# Patient Record
Sex: Male | Born: 1960 | Race: White | Hispanic: No | Marital: Married | Smoking: Never smoker
Health system: Southern US, Community
[De-identification: ages and names within clinical notes are randomized; demographics above are authoritative.]

## PROBLEM LIST (undated history)

## (undated) DIAGNOSIS — I10 Essential (primary) hypertension: Secondary | ICD-10-CM

## (undated) DIAGNOSIS — K648 Other hemorrhoids: Secondary | ICD-10-CM

## (undated) DIAGNOSIS — N189 Chronic kidney disease, unspecified: Secondary | ICD-10-CM

## (undated) DIAGNOSIS — E119 Type 2 diabetes mellitus without complications: Secondary | ICD-10-CM

## (undated) HISTORY — PX: COLONOSCOPY: SHX174

---

## 2004-04-14 ENCOUNTER — Ambulatory Visit: Payer: Self-pay

## 2006-03-25 ENCOUNTER — Ambulatory Visit: Payer: Self-pay

## 2007-07-05 ENCOUNTER — Ambulatory Visit: Payer: Self-pay | Admitting: Family Medicine

## 2007-11-18 IMAGING — CT CT ABD-PELV W/O CM
1 of 2 series · 15 of 32 positions shown, 19 images · non-contrast
Comparison: none

REASON FOR EXAM: Right flank pain, history of stones
COMMENTS:

[Series 2: stone · axial · 0.91mm/px · z∈[+337,+814]mm · 15 of 173 slices shown, 19 images]
[im 7/173  soft-tissue]
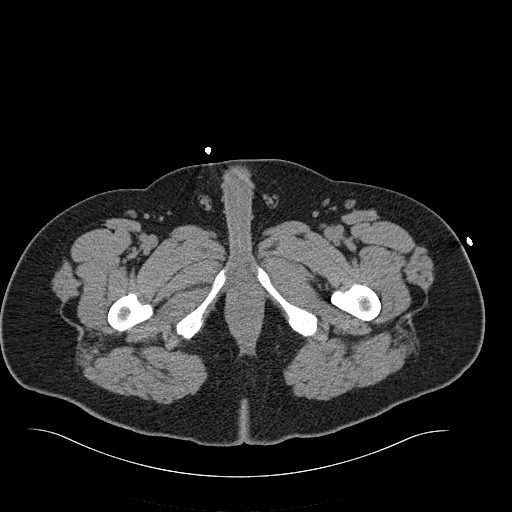
[im 7/173  bone]
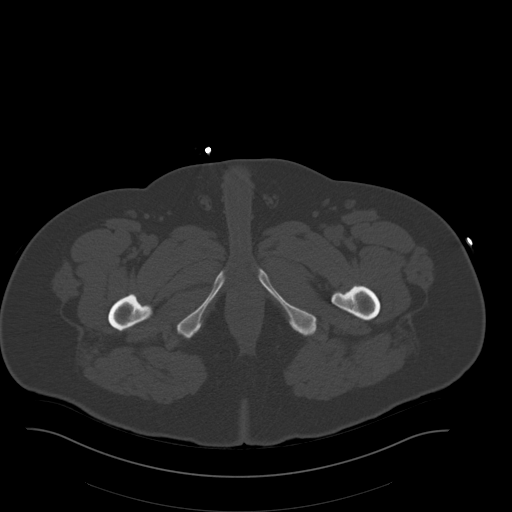
[im 21/173  soft-tissue]
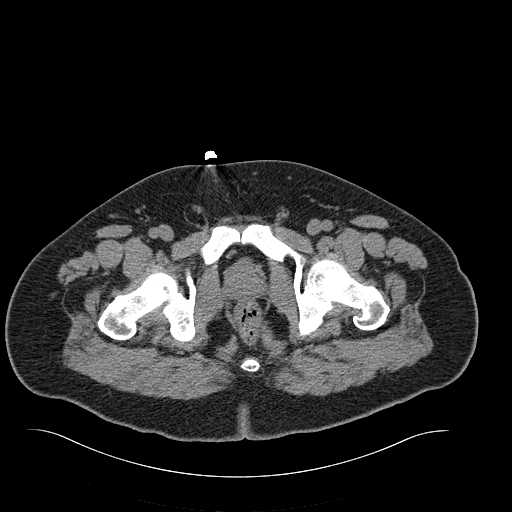
[im 35/173  soft-tissue]
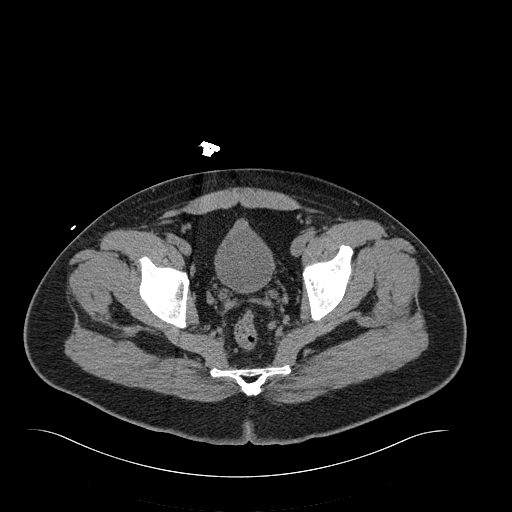
[im 49/173  soft-tissue]
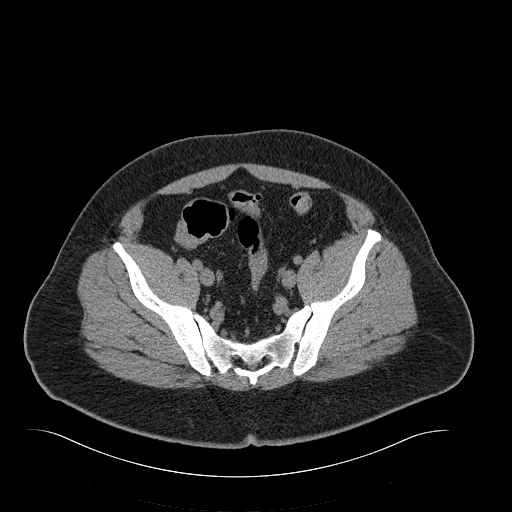
[im 62/173  soft-tissue]
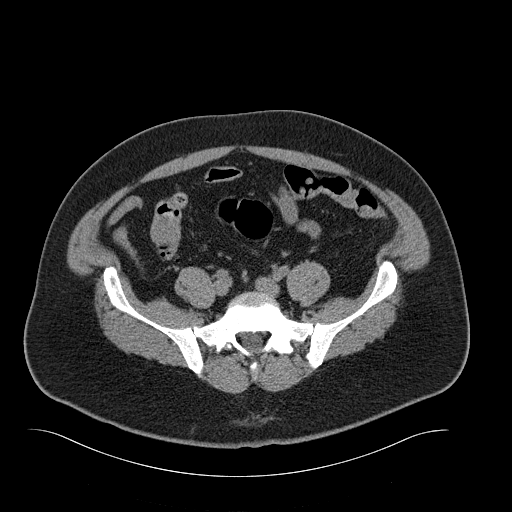
[im 76/173  soft-tissue]
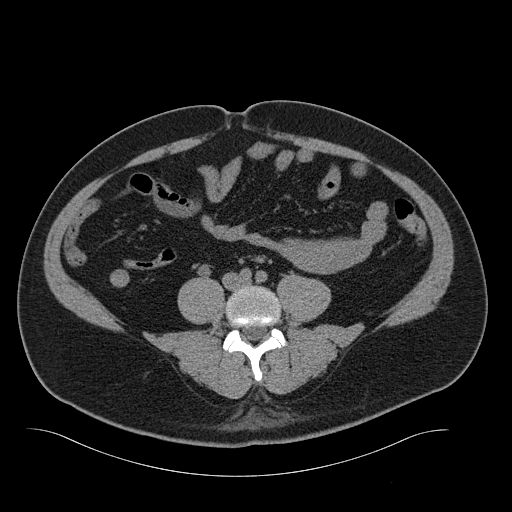
[im 90/173  soft-tissue]
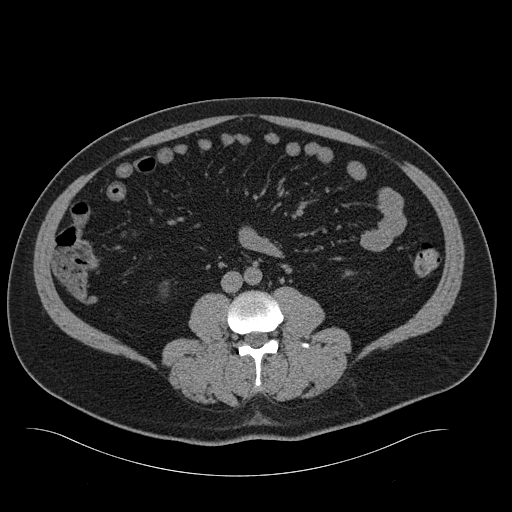
[im 97/173  soft-tissue]
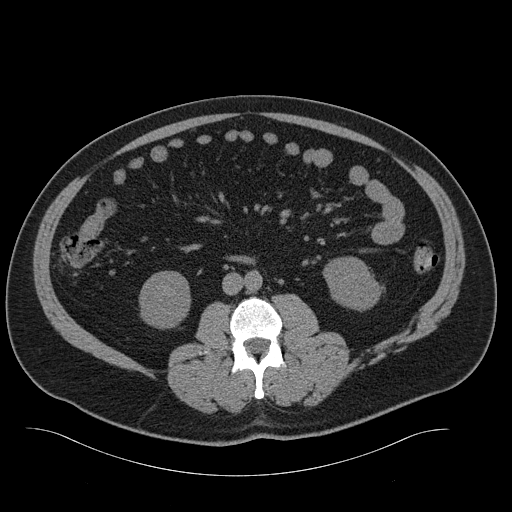
[im 111/173  soft-tissue]
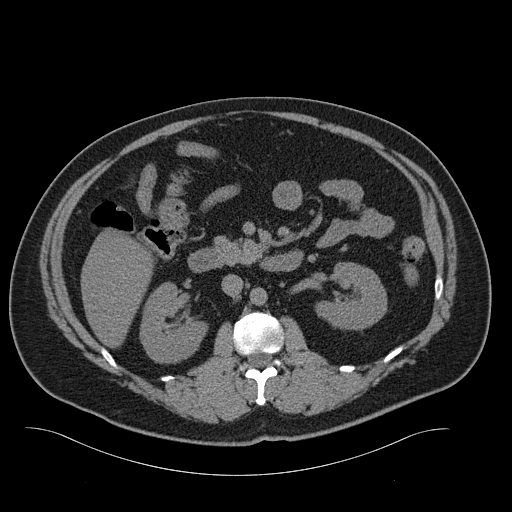
[im 111/173  bone]
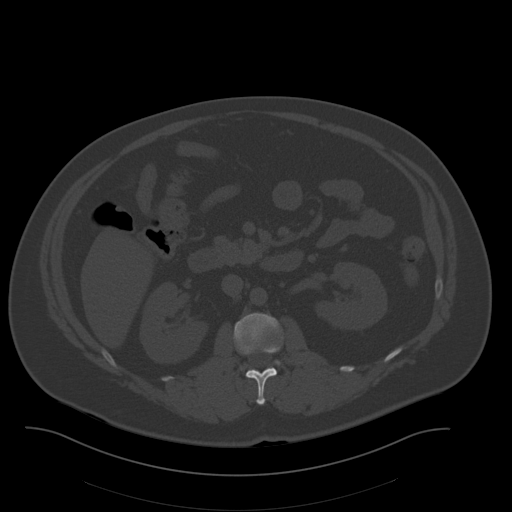
[im 124/173  soft-tissue]
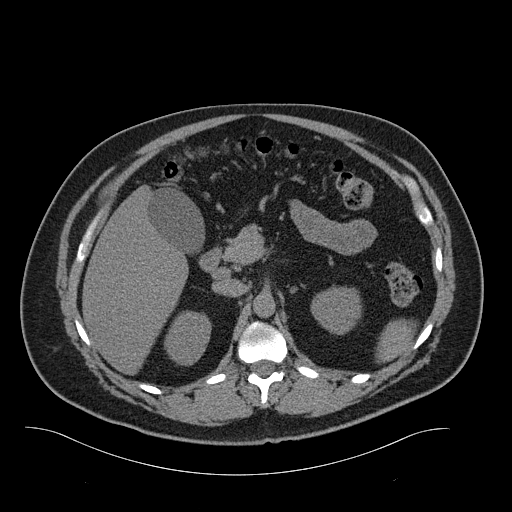
[im 138/173  soft-tissue]
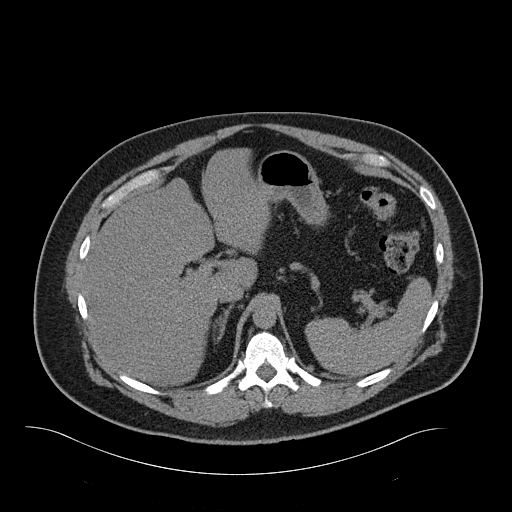
[im 145/173  lung]
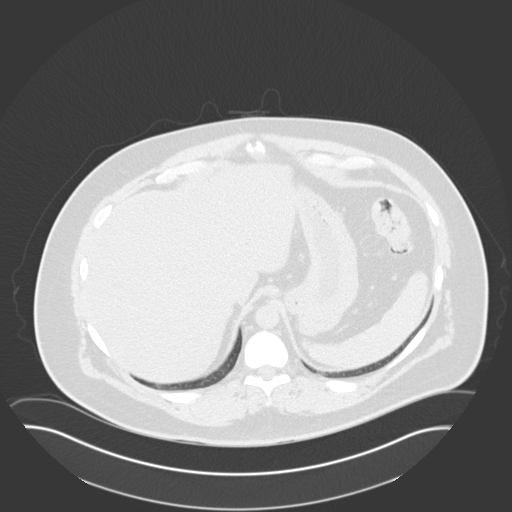
[im 152/173  soft-tissue]
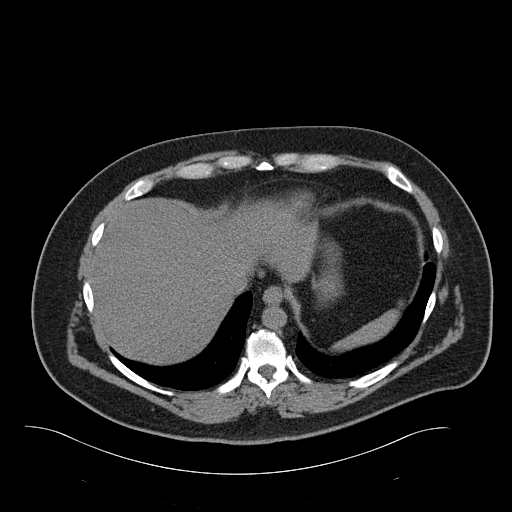
[im 152/173  lung]
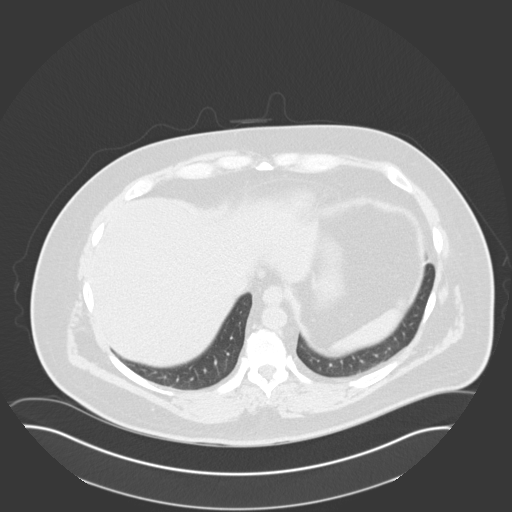
[im 159/173  lung]
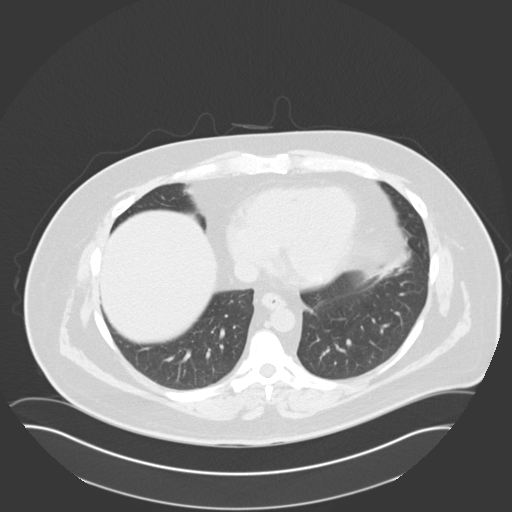
[im 166/173  soft-tissue]
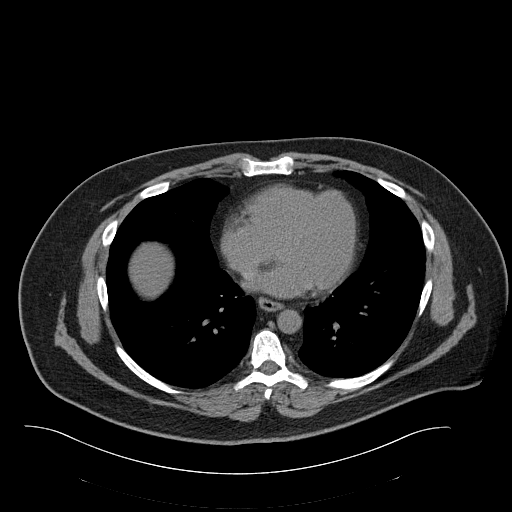
[im 166/173  lung]
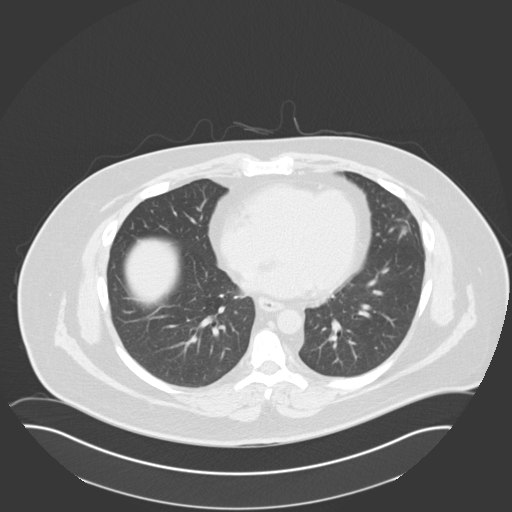

[15 of 32 positions shown; findings below may reference images not displayed]

PROCEDURE:     CT  - CT ABDOMEN AND PELVIS W[DATE] [DATE]

RESULT:  Helical noncontrast 3.0 mm sections were obtained from the lung
bases through the pubic symphysis.

Evaluation of the lung bases demonstrates no gross abnormalities. Evaluation
of the LEFT kidney demonstrates a small, 1.0 to 2.0 mm calcified density
within the medullary portion of the kidney. This is in the posterior lateral
lower pole region. There is no evidence of hydronephrosis or gross evidence
of masses. Evaluation of the RIGHT kidney demonstrates no evidence of
hydronephrosis, masses or calculi. Noncontrast evaluation of the liver,
spleen, adrenals and pancreas is unremarkable. There is no evidence of upper
abdominal free fluid, drainable loculated fluid collections, masses or
adenopathy. The appendix is identified within the RIGHT lower quadrant and
is unremarkable. There is no evidence of pelvic free fluid, drainable
loculated fluid collections, masses or adenopathy. No evidence of abdominal
aortic aneurysm is appreciated. There is no evidence of calcified gallstones.
IMPRESSION: Small, 2.0 mm, non-obstructing, medullary calculus within
the LEFT kidney. Otherwise, no further abnormality is appreciated.

## 2015-05-26 ENCOUNTER — Other Ambulatory Visit: Payer: Self-pay | Admitting: Gastroenterology

## 2015-05-26 DIAGNOSIS — R7989 Other specified abnormal findings of blood chemistry: Secondary | ICD-10-CM

## 2015-05-26 DIAGNOSIS — R945 Abnormal results of liver function studies: Secondary | ICD-10-CM

## 2015-06-02 ENCOUNTER — Ambulatory Visit
Admission: RE | Admit: 2015-06-02 | Discharge: 2015-06-02 | Disposition: A | Discharge status: Home / Self Care | Payer: 59 | Source: Ambulatory Visit | Attending: Gastroenterology | Admitting: Gastroenterology

## 2015-06-02 DIAGNOSIS — R945 Abnormal results of liver function studies: Secondary | ICD-10-CM

## 2015-06-02 DIAGNOSIS — R7989 Other specified abnormal findings of blood chemistry: Secondary | ICD-10-CM | POA: Insufficient documentation

## 2015-06-02 DIAGNOSIS — K760 Fatty (change of) liver, not elsewhere classified: Secondary | ICD-10-CM | POA: Insufficient documentation

## 2015-06-25 ENCOUNTER — Encounter: Payer: Self-pay | Admitting: *Deleted

## 2015-06-26 ENCOUNTER — Encounter
Admission: RE | Disposition: A | Discharge status: Home / Self Care | Payer: Self-pay | Source: Ambulatory Visit | Attending: Gastroenterology

## 2015-06-26 ENCOUNTER — Encounter: Payer: Self-pay | Admitting: *Deleted

## 2015-06-26 ENCOUNTER — Ambulatory Visit: Payer: 59 | Admitting: Anesthesiology

## 2015-06-26 ENCOUNTER — Ambulatory Visit
Admission: RE | Admit: 2015-06-26 | Discharge: 2015-06-26 | Disposition: A | Discharge status: Home / Self Care | Payer: 59 | Source: Ambulatory Visit | Attending: Gastroenterology | Admitting: Gastroenterology

## 2015-06-26 DIAGNOSIS — Z87442 Personal history of urinary calculi: Secondary | ICD-10-CM | POA: Insufficient documentation

## 2015-06-26 DIAGNOSIS — E1122 Type 2 diabetes mellitus with diabetic chronic kidney disease: Secondary | ICD-10-CM | POA: Diagnosis not present

## 2015-06-26 DIAGNOSIS — Z79899 Other long term (current) drug therapy: Secondary | ICD-10-CM | POA: Insufficient documentation

## 2015-06-26 DIAGNOSIS — I129 Hypertensive chronic kidney disease with stage 1 through stage 4 chronic kidney disease, or unspecified chronic kidney disease: Secondary | ICD-10-CM | POA: Insufficient documentation

## 2015-06-26 DIAGNOSIS — K5730 Diverticulosis of large intestine without perforation or abscess without bleeding: Secondary | ICD-10-CM | POA: Insufficient documentation

## 2015-06-26 DIAGNOSIS — N189 Chronic kidney disease, unspecified: Secondary | ICD-10-CM | POA: Diagnosis not present

## 2015-06-26 DIAGNOSIS — Z7984 Long term (current) use of oral hypoglycemic drugs: Secondary | ICD-10-CM | POA: Insufficient documentation

## 2015-06-26 DIAGNOSIS — Z1211 Encounter for screening for malignant neoplasm of colon: Secondary | ICD-10-CM | POA: Insufficient documentation

## 2015-06-26 HISTORY — DX: Other hemorrhoids: K64.8

## 2015-06-26 HISTORY — DX: Chronic kidney disease, unspecified: N18.9

## 2015-06-26 HISTORY — DX: Type 2 diabetes mellitus without complications: E11.9

## 2015-06-26 HISTORY — DX: Essential (primary) hypertension: I10

## 2015-06-26 HISTORY — PX: COLONOSCOPY WITH PROPOFOL: SHX5780

## 2015-06-26 LAB — GLUCOSE, CAPILLARY: GLUCOSE-CAPILLARY: 279 mg/dL — AB (ref 65–99)

## 2015-06-26 SURGERY — COLONOSCOPY WITH PROPOFOL
Anesthesia: General

## 2015-06-26 MED ORDER — LIDOCAINE HCL (PF) 1 % IJ SOLN
2.0000 mL | Freq: Once | INTRAMUSCULAR | Status: AC
  Administered 2015-06-26: 0.03 mL via INTRADERMAL

## 2015-06-26 MED ORDER — LIDOCAINE HCL (PF) 1 % IJ SOLN
INTRAMUSCULAR | Status: AC
Start: 2015-06-26 — End: 2015-06-26
  Administered 2015-06-26: 0.03 mL via INTRADERMAL
  Filled 2015-06-26: qty 2

## 2015-06-26 MED ORDER — PROPOFOL 10 MG/ML IV BOLUS
INTRAVENOUS | Status: DC | PRN
  Administered 2015-06-26: 100 mg via INTRAVENOUS

## 2015-06-26 MED ORDER — PHENYLEPHRINE HCL 10 MG/ML IJ SOLN
INTRAMUSCULAR | Status: DC | PRN
  Administered 2015-06-26: 100 ug via INTRAVENOUS

## 2015-06-26 MED ORDER — PROPOFOL 500 MG/50ML IV EMUL
INTRAVENOUS | Status: DC | PRN
  Administered 2015-06-26: 200 ug/kg/min via INTRAVENOUS

## 2015-06-26 MED ORDER — SODIUM CHLORIDE 0.9 % IV SOLN
INTRAVENOUS | Status: DC
  Administered 2015-06-26: 1000 mL via INTRAVENOUS

## 2015-06-26 MED ORDER — MIDAZOLAM HCL 5 MG/5ML IJ SOLN
INTRAMUSCULAR | Status: DC | PRN
  Administered 2015-06-26: 1 mg via INTRAVENOUS

## 2015-06-26 MED ORDER — SODIUM CHLORIDE 0.9 % IV SOLN
INTRAVENOUS | Status: DC
  Administered 2015-06-26: 13:00:00 via INTRAVENOUS

## 2015-06-26 MED ORDER — FENTANYL CITRATE (PF) 100 MCG/2ML IJ SOLN
INTRAMUSCULAR | Status: DC | PRN
  Administered 2015-06-26: 50 ug via INTRAVENOUS

## 2015-06-26 MED ORDER — LIDOCAINE 2% (20 MG/ML) 5 ML SYRINGE
INTRAMUSCULAR | Status: DC | PRN
  Administered 2015-06-26: 40 mg via INTRAVENOUS

## 2015-06-26 NOTE — H&P (Signed)
    Primary Care Physician:  Jerl MinaJames Hedrick, MD Primary Gastroenterologist:  Dr. Bluford Kaufmannh  Pre-Procedure History & Physical: HPI:  James Poole is a 55 y.o. male is here for an colonoscopy   Past Medical History  Diagnosis Date  . Hypertension   . Internal hemorrhoids   . Chronic kidney disease     Kidney Stones    Past Surgical History  Procedure Laterality Date  . Colonoscopy      Prior to Admission medications   Medication Sig Start Date End Date Taking? Authorizing Provider  fluticasone (FLONASE) 50 MCG/ACT nasal spray Place 2 sprays into both nostrils daily.   Yes Historical Provider, MD  loratadine (CLARITIN) 10 MG tablet Take 10 mg by mouth daily.   Yes Historical Provider, MD  metFORMIN (GLUCOPHAGE) 500 MG tablet Take by mouth 2 (two) times daily with a meal.   Yes Historical Provider, MD  olmesartan-hydrochlorothiazide (BENICAR HCT) 20-12.5 MG tablet Take 1 tablet by mouth daily.   Yes Historical Provider, MD    Allergies as of 06/17/2015  . (Not on File)    History reviewed. No pertinent family history.  Social History   Social History  . Marital Status: Married    Spouse Name: N/A  . Number of Children: N/A  . Years of Education: N/A   Occupational History  . Not on file.   Social History Main Topics  . Smoking status: Never Smoker   . Smokeless tobacco: Never Used  . Alcohol Use: No  . Drug Use: No  . Sexual Activity: Not on file   Other Topics Concern  . Not on file   Social History Narrative  . No narrative on file    Review of Systems: See HPI, otherwise negative ROS  Physical Exam: There were no vitals taken for this visit. General:   Alert,  pleasant and cooperative in NAD Head:  Normocephalic and atraumatic. Neck:  Supple; no masses or thyromegaly. Lungs:  Clear throughout to auscultation.    Heart:  Regular rate and rhythm. Abdomen:  Soft, nontender and nondistended. Normal bowel sounds, without guarding, and without rebound.    Neurologic:  Alert and  oriented x4;  grossly normal neurologically.  Impression/Plan: James Poole is here for an colonoscopy to be performed for screening  Risks, benefits, limitations, and alternatives regarding colonoscopy have been reviewed with the patient.  Questions have been answered.  All parties agreeable.   Patrcia Schnepp, Ezzard StandingPAUL Y, MD  06/26/2015, 12:28 PM

## 2015-06-26 NOTE — Anesthesia Preprocedure Evaluation (Signed)
Anesthesia Evaluation  Patient identified by MRN, date of birth, ID band Patient awake    Reviewed: Allergy & Precautions, H&P , NPO status , Patient's Chart, lab work & pertinent test results  History of Anesthesia Complications Negative for: history of anesthetic complications  Airway Mallampati: III  TM Distance: >3 FB Neck ROM: full    Dental  (+) Poor Dentition   Pulmonary neg pulmonary ROS, neg shortness of breath,    Pulmonary exam normal breath sounds clear to auscultation       Cardiovascular Exercise Tolerance: Good hypertension, (-) angina(-) Past MI and (-) DOE Normal cardiovascular exam Rhythm:regular Rate:Normal     Neuro/Psych negative neurological ROS  negative psych ROS   GI/Hepatic Neg liver ROS, GERD  Controlled and Medicated,  Endo/Other  diabetes, Type 2  Renal/GU Renal disease  negative genitourinary   Musculoskeletal   Abdominal   Peds  Hematology negative hematology ROS (+)   Anesthesia Other Findings Past Medical History:   Hypertension                                                 Internal hemorrhoids                                         Chronic kidney disease                                         Comment:Kidney Stones   Diabetes mellitus without complication (HCC)                Past Surgical History:   COLONOSCOPY                                                  BMI    Body Mass Index   33.48 kg/m 2      Reproductive/Obstetrics negative OB ROS                             Anesthesia Physical Anesthesia Plan  ASA: III  Anesthesia Plan: General   Post-op Pain Management:    Induction:   Airway Management Planned:   Additional Equipment:   Intra-op Plan:   Post-operative Plan:   Informed Consent: I have reviewed the patients History and Physical, chart, labs and discussed the procedure including the risks, benefits and alternatives  for the proposed anesthesia with the patient or authorized representative who has indicated his/her understanding and acceptance.   Dental Advisory Given  Plan Discussed with: Anesthesiologist, CRNA and Surgeon  Anesthesia Plan Comments:         Anesthesia Quick Evaluation

## 2015-06-26 NOTE — Anesthesia Postprocedure Evaluation (Signed)
Anesthesia Post Note  Patient: James Poole  Procedure(s) Performed: Procedure(s) (LRB): COLONOSCOPY WITH PROPOFOL (N/A)  Patient location during evaluation: PACU Anesthesia Type: General Level of consciousness: awake and alert Pain management: pain level controlled Vital Signs Assessment: post-procedure vital signs reviewed and stable Respiratory status: spontaneous breathing, nonlabored ventilation, respiratory function stable and patient connected to nasal cannula oxygen Cardiovascular status: blood pressure returned to baseline and stable Postop Assessment: no signs of nausea or vomiting Anesthetic complications: no    Last Vitals:  Filed Vitals:   06/26/15 1233 06/26/15 1331  BP: 145/87 121/76  Pulse: 112 107  Temp: 36.3 C 36.3 C  Resp: 17 25    Last Pain: There were no vitals filed for this visit.               [name]

## 2015-06-26 NOTE — Transfer of Care (Signed)
Immediate Anesthesia Transfer of Care Note  Patient: James Poole  Procedure(s) Performed: Procedure(s): COLONOSCOPY WITH PROPOFOL (N/A)  Patient Location: PACU and Endoscopy Unit  Anesthesia Type:General  Level of Consciousness: awake, oriented and patient cooperative  Airway & Oxygen Therapy: Patient Spontanous Breathing and Patient connected to nasal cannula oxygen  Post-op Assessment: Report given to RN and Post -op Vital signs reviewed and stable  Post vital signs: Reviewed and stable  Last Vitals:  Filed Vitals:   06/26/15 1233  BP: 145/87  Pulse: 112  Temp: 36.3 C  Resp: 17    Last Pain: There were no vitals filed for this visit.       Complications: No apparent anesthesia complications

## 2015-06-26 NOTE — Op Note (Signed)
[facility] Gastroenterology Patient Name: James Poole Procedure Date: 06/26/2015 1:02 PM MRN: [id number] Account #: [id number] Date of Birth: 1960/12/02 Admit Type: Outpatient Age: 55 [facility] Gender: Male Note Status: Finalized Procedure:            Colonoscopy Indications:          Screening for colorectal malignant neoplasm Providers:            Ezzard StandingPaul Y. Bluford Kaufmannh, MD Referring MD:         Rhona LeavensJames F. Burnett ShengHedrick, MD (Referring MD) Medicines:            Monitored Anesthesia Care Complications:        No immediate complications. Procedure:            Pre-Anesthesia Assessment:                       - Prior to the procedure, a History and Physical was                        performed, and patient medications, allergies and                        sensitivities were reviewed. The patient's tolerance of                        previous anesthesia was reviewed.                       - The risks and benefits of the procedure and the                        sedation options and risks were discussed with the                        patient. All questions were answered and informed                        consent was obtained.                       - After reviewing the risks and benefits, the patient                        was deemed in satisfactory condition to undergo the                        procedure.                       After obtaining informed consent, the colonoscope was                        passed under direct vision. Throughout the procedure,                        the patient's blood pressure, pulse, and oxygen                        saturations were monitored continuously. The  Colonoscope was introduced through the anus and                        advanced to the the cecum, identified by appendiceal                        orifice and ileocecal valve. The colonoscopy was                        performed without difficulty. The  patient tolerated the                        procedure well. The quality of the bowel preparation                        was good. Findings:      Multiple small and large-mouthed diverticula were found in the sigmoid       colon.      The exam was otherwise without abnormality. Impression:           - Diverticulosis in the sigmoid colon.                       - The examination was otherwise normal.                       - No specimens collected. Recommendation:       - Discharge patient to home.                       - Repeat colonoscopy in 10 years for surveillance.                       - The findings and recommendations were discussed with                        the patient. Procedure Code(s):    --- Professional ---                       [phone], Colonoscopy, flexible; diagnostic, including                        collection of specimen(s) by brushing or washing, when                        performed (separate procedure) Diagnosis Code(s):    --- Professional ---                       Z12.11, Encounter for screening for malignant neoplasm                        of colon                       K57.30, Diverticulosis of large intestine without                        perforation or abscess without bleeding CPT copyright 2016 [employer]. All rights reserved. The codes documented in this report are preliminary and upon coder review may  be revised to meet current compliance requirements. Wallace Cullens,  MD 06/26/2015 1:25:18 PM This report has been signed electronically. Number of Addenda: 0 Note Initiated On: 06/26/2015 1:02 PM Total Procedure Duration: 0 hours 10 minutes 38 seconds       [facility]

## 2015-07-01 ENCOUNTER — Encounter: Payer: Self-pay | Admitting: Gastroenterology

## 2015-12-03 NOTE — Addendum Note (Signed)
Addendum  created 12/03/15 1243 by Charna Busmanhomas Domitila Stetler, CRNA   Delete clinical note

## 2017-10-09 IMAGING — US US ABDOMEN LIMITED
1 series · 14 of 25 positions shown · non-contrast
Comparison: None.

CLINICAL DATA: Abnormal LFT

EXAM:
US ABDOMEN LIMITED - RIGHT UPPER QUADRANT

[Series 1: us abdomen limited · 0.23mm/px · 14 of 46 slices shown]
[im 1/46]
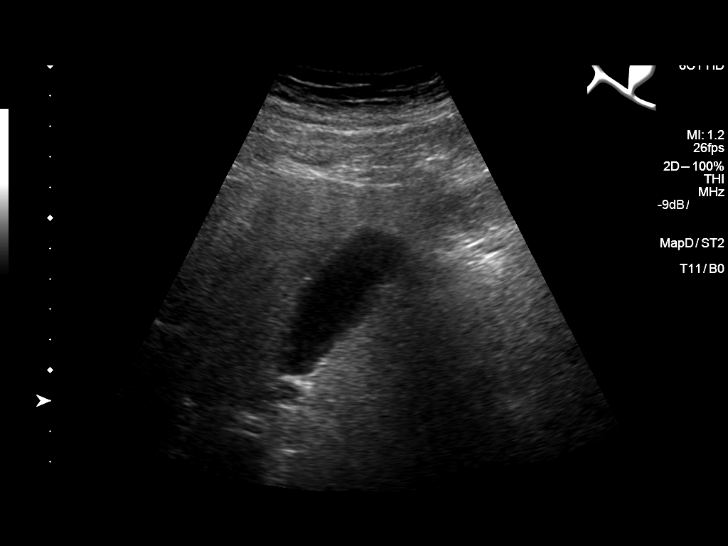
[im 4/46]
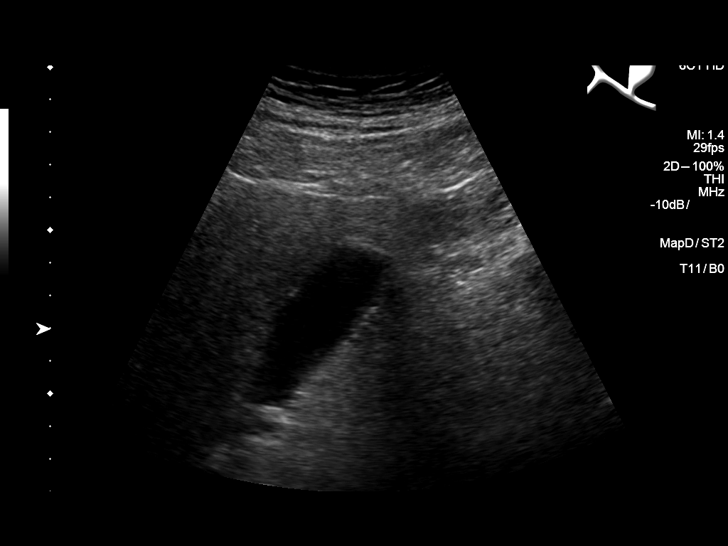
[im 8/46]
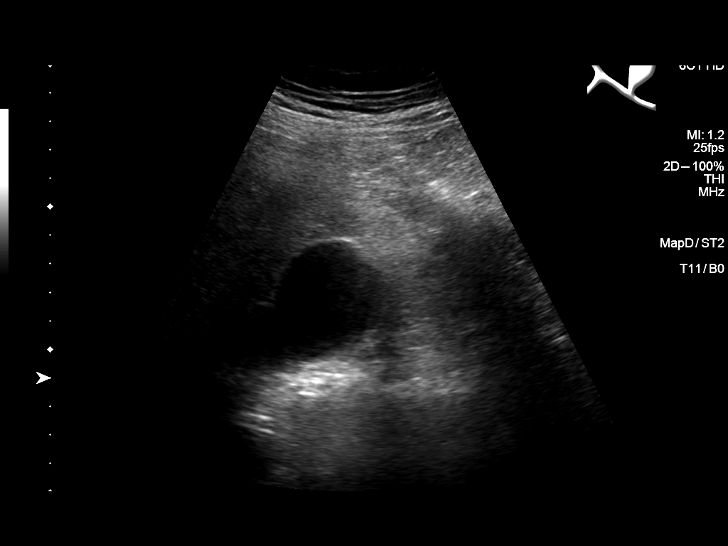
[im 12/46]
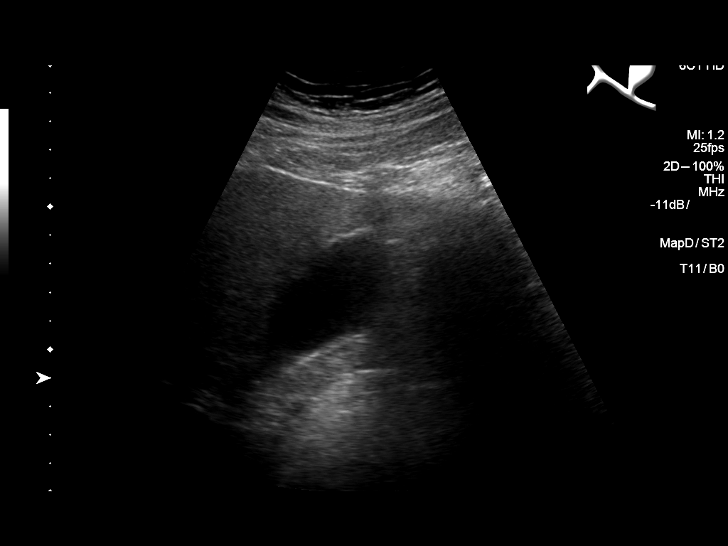
[im 16/46]
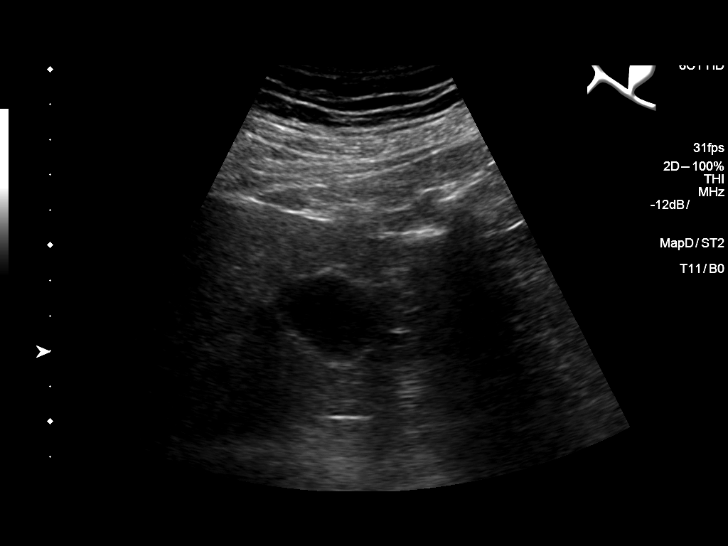
[im 17/46]
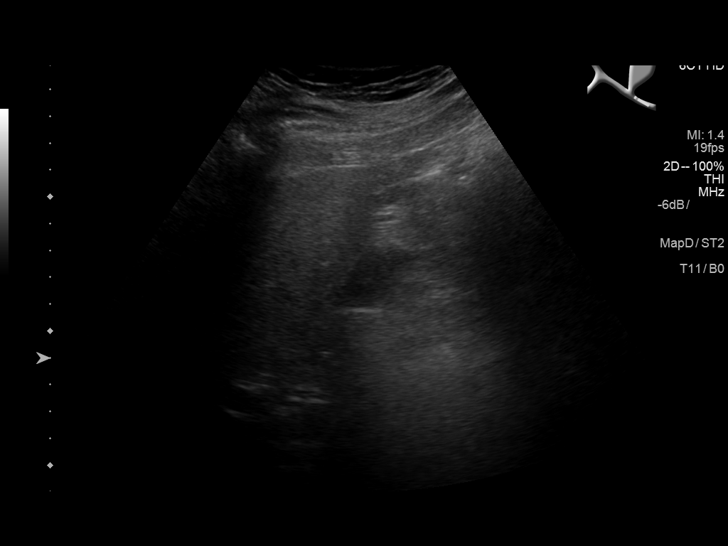
[im 21/46]
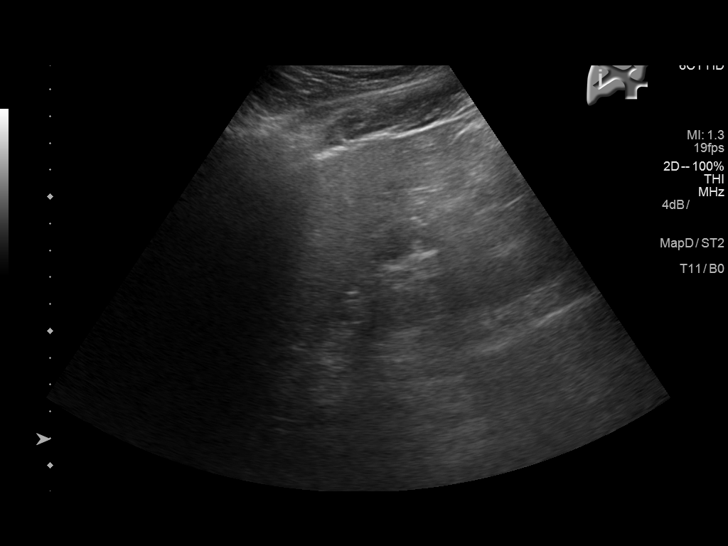
[im 25/46]
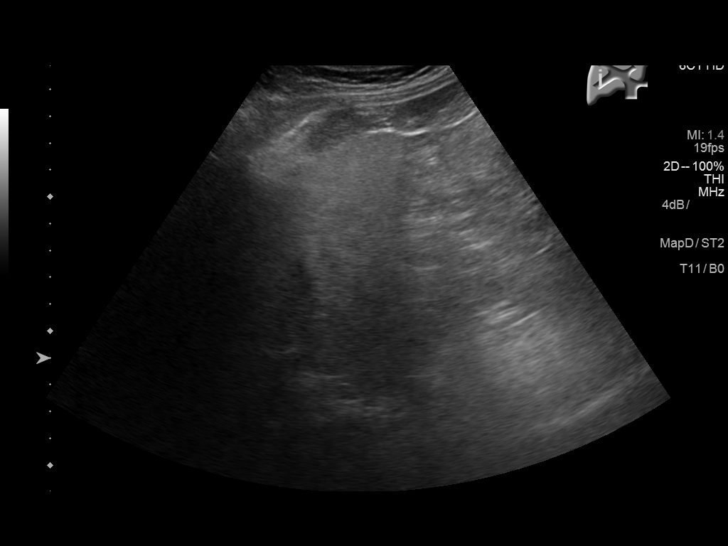
[im 29/46]
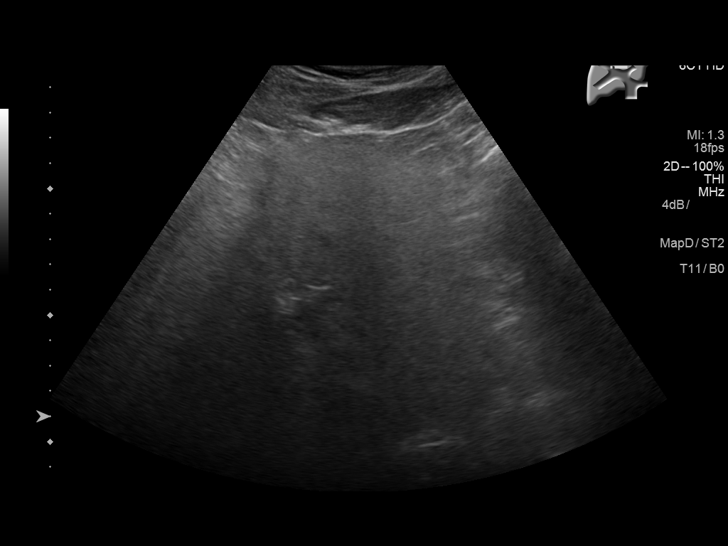
[im 31/46]
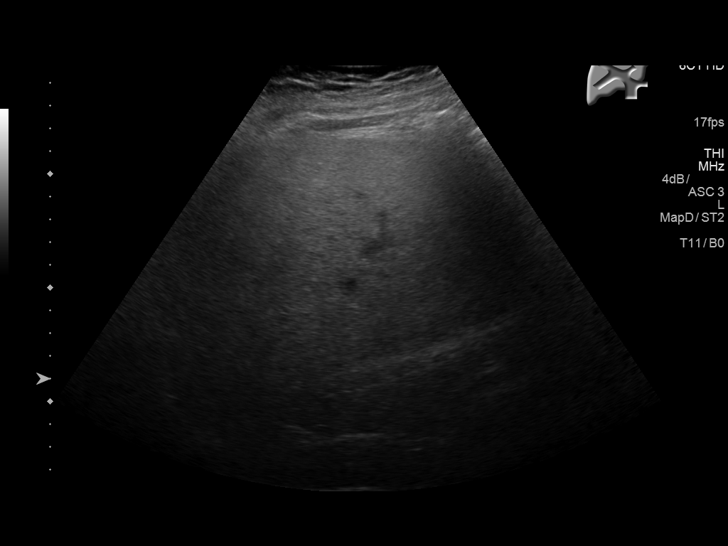
[im 34/46]
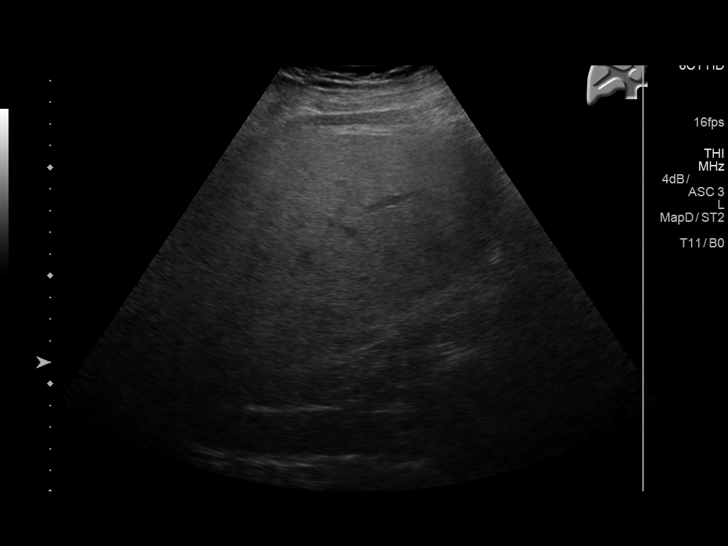
[im 38/46]
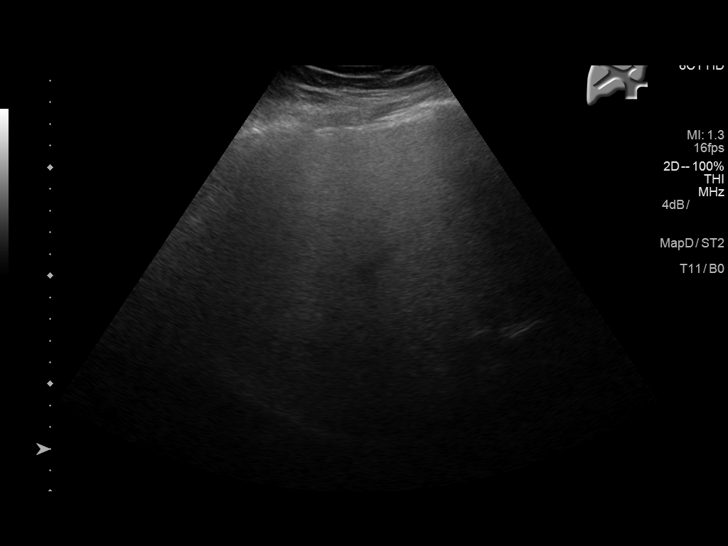
[im 42/46]
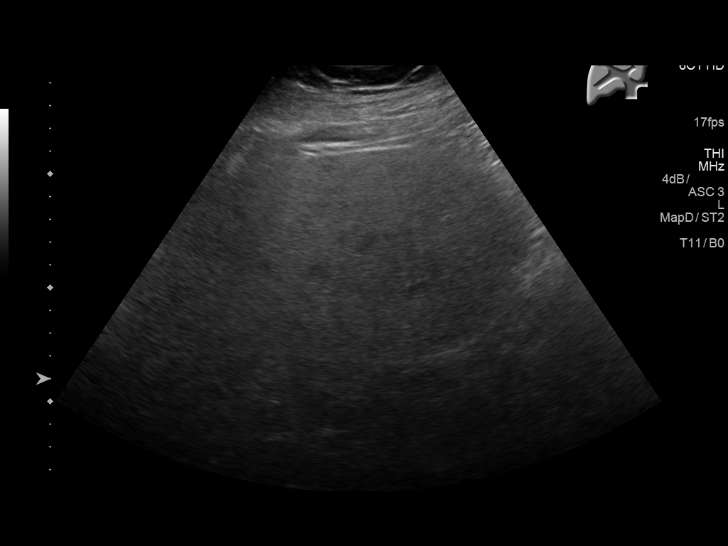
[im 46/46]
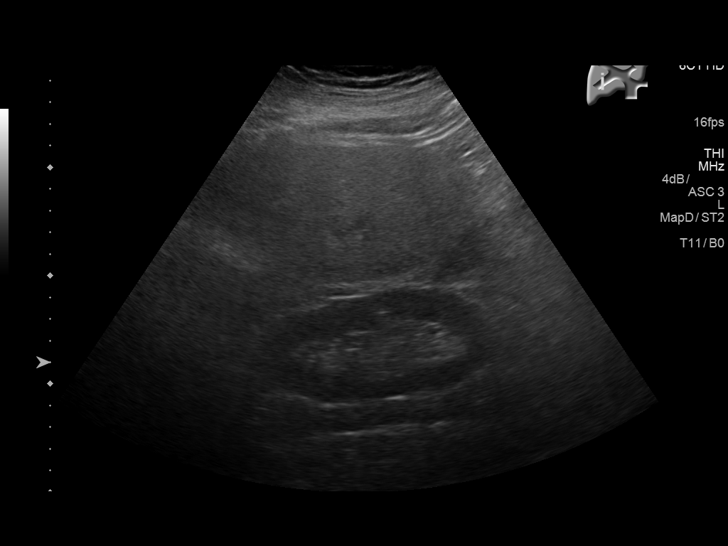

[14 of 25 positions shown; findings below may reference images not displayed]

FINDINGS: Gallbladder:

No gallstones or wall thickening visualized. No sonographic Murphy
sign noted by sonographer.

Common bile duct:

Diameter: 3.5 mm

Liver:

Diffusely echogenic liver parenchyma compatible fatty infiltration.
No focal liver lesion.
IMPRESSION: Fatty infiltration liver.  No gallstones.
